# Patient Record
Sex: Female | Born: 2000 | Race: Black or African American | Hispanic: No | Marital: Single | Smoking: Never smoker
Health system: Southern US, Community
[De-identification: ages and names within clinical notes are randomized; demographics above are authoritative.]

## PROBLEM LIST (undated history)

## (undated) DIAGNOSIS — F319 Bipolar disorder, unspecified: Secondary | ICD-10-CM

## (undated) DIAGNOSIS — F32A Depression, unspecified: Secondary | ICD-10-CM

## (undated) DIAGNOSIS — R4689 Other symptoms and signs involving appearance and behavior: Secondary | ICD-10-CM

## (undated) DIAGNOSIS — F329 Major depressive disorder, single episode, unspecified: Secondary | ICD-10-CM

---
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
## 2023-07-04 NOTE — Progress Notes (Signed)
 Psychiatric Facility Acceptance Note   ACCEPTED TO: [facility]  BY:  Ebony  ON BEHALF OF: Dr. Oneil Charleston  CALL REPORT TO: [phone]. EMTALA:  initiated  TRANSPORTATION CALLED: [facility], awaiting custody order  NOTIFIED patient OF:  FACILITY ACCEPTANCE: [facility] TRANSPORT[facility] BY: [employer], [employer] 07/04/2023 12:47 PM
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
## 2023-07-18 NOTE — Progress Notes (Signed)
  Psychiatry was alerted by the security team that patient had eloped from the hospital. Attempted to reach family to no avail.   LAWERENCE GUT, NP

## 2023-07-18 NOTE — Progress Notes (Signed)
 Emergency Psychiatry - Follow-up Evaluation  Subjective Met with patient for reassessment in her room. Patient found sitting up in bed talking with the sitter. She asks if she could be discharged home to just go and drink as she does not care about getting better. She expresses disappointment in her relationships with people in the community, the health care system and the religious organizations. She states that a church once turned her back because of the way she dressed and she felt bad because she believed that churches are meant to accept people the way they are. Patient denies feeling suicidal or homicidal but would not care if she is no longer alive. Patient states that she gets more support from the drug dealers and gang members on the street and would not mind returning to them.   Objective PATIENT SUMMARY <redacted file path>  SYNOPSIS <redacted file path>  RESULTS REVIEW <redacted file path>  EVENT LOG <redacted file path>  REVIEW FLOWSHEETS <redacted file path> Temp:  [36.8 C (98.2 F)] 36.8 C (98.2 F) Heart Rate:  [77-88] 77 Resp:  [16-18] 18 BP: (122-133)/(84-96) 122/96 Physical Exam  Mental Status Exam <redacted file path> Mental Status Exam Appearance:  (obese, fairly groomed, good eye contact) Attitude: cooperative, defensive Psychomotor: normal Mood:  (I feel numb) Affect: labile Speech: fluent, regular rate and volume Thought Process:  (linear, goal directed and appears volitionally illogical at time) Thought Content: Normal Perceptual: no auditory or visual hallucinations Cognition: attention intact Orientation: oriented x 4 Memory: grossly intact Intelligence: consistent with age and education level Judgement: severely impaired Insight: severely impaired Suicidal: not present Violent/Homicidal: no homicidal/violent ideation    Psychometrics:    Labs: Chemistry: Recent Labs  Lab 07/16/23 1846 07/16/23 2208 07/17/23 0134 07/17/23 0519  NA  142  142 141  141   < > 139  K 3.5 3.2*   < > 3.5  CL 110* 112*   < > 112*  CO2 20* 21   < > 19*  BUN 8 7   < > 5*  CREATININE 1.0 0.9   < > 0.7  GLUCOSE 83 81   < > 85  CALCIUM 8.5* 8.3*   < > 8.1*  MG 1.9 1.9  --   --   PHOS 3.2  --   --   --    < > = values in this interval not displayed.   CBC: Recent Labs  Lab 07/16/23 1846  WBC 13.2*  HGB 12.9  HCT 36.8  MCV 90  PLT 275    EKG: Recent Labs  Lab 07/16/23 2304  VENTRATE 80  PRINTERVAL 190  QRSINTERVAL 98  QTINTERVAL 388  QTC 447   Urinalysis: Recent Labs  Lab 07/16/23 2314  COLORU Colorless  CLARITYU Clear  SPECGRAV 1.014  LABPH 7.0  PROTEINUA Negative  GLUCOSEU Negative  KETONESU Negative  BLOODU Negative  NITRITE Negative  LEUKOCYTESUR Negative  BILIRUBINUR Negative  UROBILINOGEN 0.2     Endocrine:No results for input(s): POCGLU, HGBA1C, TSH, T4FREE in the last 168 hours.  Invalid input(s): T3 Pregnancy: Lab Results  Component Value Date   POCHCG Negative 07/04/2023   Liver function: Recent Labs  Lab 07/17/23 0134  AST 21  ALT 18  TOTALPROTEIN 5.6*  ALB 3.2*  TBILI 0.5  ALKPHOS 60     Toxicology Screen:  Recent Labs    05/10/23 2251 07/04/23 0346 07/16/23 2314  AMPHETUR Negative Negative Negative  BARBURINE Negative Negative Negative  BENZOUR Negative Negative Positive*  COCAINESCRN Negative Negative Negative  OPIATESUR Negative Negative Negative  THCURINE Positive* Negative Negative   Coronavirus (COVID-19) SARS-CoV-2 PCR/NAAT  Date Value Ref Range Status  11/10/2019 Not Detected   Final   POC Coronavirus (COVID-19) SARS-CoV-2 Rapid Test  Date Value Ref Range Status  05/08/2023 Not Detected Not Detected Final   Coronavirus (COVID-19) SARS-CoV-2 Rapid Test  Date Value Ref Range Status  05/10/2023 Not Detected Not Detected Final      Assessment Safety assessment:  Ask Suicide-Screening Questions (ASQ) <redacted file path> 1. In the past few weeks, have you  wished you were dead?: No (2023/07/29 1845) 2. In the past few weeks, have you felt that you or your family would be better off if you were dead?: No (29-Jul-2023 1845) 3. In the past week, have you been having thoughts about killing yourself?: No (07-29-23 1845) 4. Have you ever tried to kill yourself?: No (29-Jul-2023 1845) 5. Are you having thoughts of killing yourself right now?: No (29-Jul-2023 1845) Suicide Risk Screening Result: Negative (no intervention necessary) (July 29, 2023 1845)   [place] has a negative ASQ screen and is at low risk for suicide.     Risk factors for harm to others: current substance use, chronic impulsivity, poor behavioral control, deficits in social cognitive or information-processing abilities, and high emotional distress  Protective factors against violence: positive social connections Violence Risk Level: moderate   Actions taken at this time to mitigate risk include:  - Acute risk is being mitigated by continuing physician/medical hold and/or IVC to maintain a safe environment.  Safety assessment:  Ask Suicide-Screening Questions (ASQ) <redacted file path> 1. In the past few weeks, have you wished you were dead?: No (07-29-23 1845) 2. In the past few weeks, have you felt that you or your family would be better off if you were dead?: No (2023/07/29 1845) 3. In the past week, have you been having thoughts about killing yourself?: No (2023-07-29 1845) 4. Have you ever tried to kill yourself?: No (2023/07/29 1845) 5. Are you having thoughts of killing yourself right now?: No (Jul 29, 2023 1845) Suicide Risk Screening Result: Negative (no intervention necessary) (July 29, 2023 1845)   [place] has a negative ASQ screen and is at low risk for suicide.    Risk factors for harm to others: current substance use, chronic impulsivity, and high emotional distress  Protective factors against violence: positive social connections Violence Risk Level: moderate   Actions taken at this time to  mitigate risk include:  - Acute risk is being mitigated by continuing physician/medical hold and/or IVC to maintain a safe environment.    Monnie is a 23 y.o. female with a history of psychiatric history of borderline personality disorder, PTSD, MDD, anxiety, and alcohol use d/o who presents for evaluation of safety after a suicide attempt by intentional ingestion of 2 bottles of Nyquil (118 ml each), 1 bottle of Dayquil (118 ml), 1 bottle of Aspirin (containing 24 tablets of 325 mg each), and 2 tablets of Zyrtec (10 mg/tablet). On interview this morning, patient presents calm, cooperative and interested in interview. She reported that the initial intention was to get high but did not care if she died in the process. She reported multiple stressors including lack of family support, and the anniversary of her children's death.   Patient has had multiple inpatient hospitalizations and she acknowledged non compliance with outpatient treatment.  She is also noted to have very chronically limited judgment and ongoing substance use, which puts him at elevated risk, although this  may not be mitigated by an acute psychiatric hospitalization. However, the chronicity of her illness, as evidenced by the multiple suicide attempts are quite concerning putting patient at elevated risk of dangerousness. We will initiate IVC and refer her to inpatient hospital.   Diagnoses:   : MDD and PTSD  [place] is a 23 y.o. female with a history of psychiatric history of borderline personality disorder, PTSD, MDD, anxiety, and alcohol use d/o who presents for evaluation of safety after a suicide attempt by intentional ingestion of 2 bottles of Nyquil (118 ml each), 1 bottle of Dayquil (118 ml), 1 bottle of Aspirin (containing 24 tablets of 325 mg each), and 2 tablets of Zyrtec (10 mg/tablet). On interview this morning, patient presents calm, cooperative and interested in interview. She reported that the initial intention was  to get high but did not care if she died in the process. She reported multiple stressors including lack of family support, and the anniversary of her children's death.    Patient has had multiple inpatient hospitalizations and she acknowledged non compliance with outpatient treatment.  She is also noted to have very chronically limited judgment and ongoing substance use, which puts him at elevated risk, although this may not be mitigated by an acute psychiatric hospitalization. However, the chronicity of her illness, as evidenced by the multiple suicide attempts are quite concerning putting patient at elevated risk of dangerousness. We will maintain patient on IVC and transfer her to [facility] when transport is available.   Diagnoses:  MDD and PTSD     Plan Disposition: Not psychiatrically stable for discharge. Our team will refer to inpatient psychiatric facilities for stabilization.  Safety:  Physician Hold/IVC: Physician Hold and IVC are indicated. Petition and 1st QPE completed on 07/17/23.  2nd QPE to be completed by accepting facility. Suicide precautions: yes Observation Status: continuous visual observation while patient in the medical ED, but transition to q15 minute checks if moved to the Behavioral Health ED  Meds: - Continue Hydroxyzine 50 mg po Q6hr PRN for anxiety - Continue Trazodone 50 mg po QHS PRN for sleep  Medically cleared  ED Follow-up: Psychiatry will follow up tomorrow. Outpatient Follow-Up: To be determined by inpatient psychiatric facility  Thank you for allowing psychiatry to be involved in this patient's care. Please call the [facility] ED Psychiatry team at [phone] for questions.     Attestation Statement:   I personally performed the service, non-incident to. (WP)   ADEPEJU DARWYN, NP

## 2023-07-20 NOTE — Progress Notes (Signed)
 Psychiatric Facility Acceptance Note   ACCEPTED TO: [facility], [address] BY:  Cat in Admissions ON BEHALF OF: Dr. Oneil Charleston CALL REPORT TO: [phone] EMTALA:  Initiated TRANSPORTATION CALLED: yes - [facility] [employer] contacted to coordinate transport with sheriff NOTIFIED patient OF:  FACILITY ACCEPTANCE: yes TRANSPORTATION BY: Law Enforcement  JUSTIN KRETZSCHMAR, LCSW, LCAS 07/20/2023 12:25 PM

## 2023-07-20 NOTE — ED Provider Notes (Signed)
 Med cleared awaiting placement and sheriff for transport.   EMTALA signed by ED attending with plan to transfer to [facility].  Patient did not require any further medical attention while under my care.   [place] Rosina Dragon, [place] 07/21/23 [phone]

## 2023-07-20 NOTE — Progress Notes (Signed)
 Received call from Emergency planning/management officer via [facility] [employer], clarifying that inpatient facility [facility]) could receive pt starting at 1900. Sheriff officer reported he would intend to arrive to ED around 1830 for transport. EM RN updated.

## 2023-08-07 ENCOUNTER — Other Ambulatory Visit: Payer: Self-pay

## 2023-08-07 ENCOUNTER — Emergency Department
Admission: EM | Admit: 2023-08-07 | Discharge: 2023-08-07 | Disposition: A | Discharge status: Home / Self Care | Attending: Emergency Medicine | Admitting: Emergency Medicine

## 2023-08-07 DIAGNOSIS — F1094 Alcohol use, unspecified with alcohol-induced mood disorder: Secondary | ICD-10-CM | POA: Insufficient documentation

## 2023-08-07 DIAGNOSIS — F332 Major depressive disorder, recurrent severe without psychotic features: Secondary | ICD-10-CM | POA: Diagnosis not present

## 2023-08-07 DIAGNOSIS — R45851 Suicidal ideations: Secondary | ICD-10-CM | POA: Insufficient documentation

## 2023-08-07 DIAGNOSIS — I10 Essential (primary) hypertension: Secondary | ICD-10-CM | POA: Diagnosis not present

## 2023-08-07 DIAGNOSIS — Y900 Blood alcohol level of less than 20 mg/100 ml: Secondary | ICD-10-CM | POA: Diagnosis not present

## 2023-08-07 LAB — ETHANOL: Alcohol, Ethyl (B): <15 mg/dL (ref <15)

## 2023-08-07 LAB — COMPREHENSIVE METABOLIC PANEL WITH GFR
ALT: 49 U/L — ABNORMAL HIGH (ref 0–44)
AST: 65 U/L — ABNORMAL HIGH (ref 15–41)
Albumin: 3.7 g/dL (ref 3.5–5.0)
Alkaline Phosphatase: 67 U/L (ref 38–126)
Anion gap: 11 (ref 5–15)
BUN: 8 mg/dL (ref 6–20)
CO2: 24 mmol/L (ref 22–32)
Calcium: 8.9 mg/dL (ref 8.9–10.3)
Chloride: 108 mmol/L (ref 98–111)
Creatinine, Ser: 0.67 mg/dL (ref 0.44–1.00)
GFR, Estimated: >60 mL/min (ref >60)
Glucose, Bld: 109 mg/dL — ABNORMAL HIGH (ref 70–99)
Potassium: 3.7 mmol/L (ref 3.5–5.1)
Sodium: 143 mmol/L (ref 135–145)
Total Bilirubin: 0.7 mg/dL (ref 0.0–1.2)
Total Protein: 6.5 g/dL (ref 6.5–8.1)

## 2023-08-07 LAB — CBC
HCT: 39 % (ref 36.0–46.0)
Hemoglobin: 12.9 g/dL (ref 12.0–15.0)
MCH: 30.6 pg (ref 26.0–34.0)
MCHC: 33.1 g/dL (ref 30.0–36.0)
MCV: 92.6 fL (ref 80.0–100.0)
Platelets: 310 K/uL (ref 150–400)
RBC: 4.21 MIL/uL (ref 3.87–5.11)
RDW: 13 % (ref 11.5–15.5)
WBC: 11.7 K/uL — ABNORMAL HIGH (ref 4.0–10.5)
nRBC: 0 % (ref 0.0–0.2)

## 2023-08-07 LAB — SALICYLATE LEVEL: Salicylate Lvl: <7 mg/dL — ABNORMAL LOW (ref 7.0–30.0)

## 2023-08-07 LAB — ACETAMINOPHEN LEVEL: Acetaminophen (Tylenol), Serum: <10 ug/mL — ABNORMAL LOW (ref 10–30)

## 2023-08-07 MED ORDER — THIAMINE HCL 100 MG/ML IJ SOLN: 100.0000 mg | Freq: Every day | INTRAMUSCULAR | Status: DC

## 2023-08-07 MED ORDER — THIAMINE MONONITRATE 100 MG PO TABS: 100.0000 mg | ORAL_TABLET | Freq: Every day | ORAL | Status: DC

## 2023-08-07 MED ORDER — ADULT MULTIVITAMIN W/MINERALS CH: 1.0000 | ORAL_TABLET | Freq: Every day | ORAL | Status: DC

## 2023-08-07 MED ORDER — FOLIC ACID 1 MG PO TABS: 1.0000 mg | ORAL_TABLET | Freq: Every day | ORAL | Status: DC

## 2023-08-07 MED ORDER — LORAZEPAM 1 MG PO TABS: 1.0000 mg | ORAL_TABLET | ORAL | Status: DC | PRN

## 2023-08-07 NOTE — ED Notes (Signed)
 Breakfast tray given to pt

## 2023-08-07 NOTE — ED Notes (Addendum)
 Pt belongings:  Multi color dress [employer] Black bag

## 2023-08-07 NOTE — ED Notes (Signed)
 Patient called father for ride home. Patient did not wish for this RN to give information to father, this RN explained patient rights to father on phone, but informed father that patient was up for discharge per patient request. All belongings returned to patient at time of discharge. Patient ambulatory to lobby to wait for ride. Patient deferred discharge vitals.

## 2023-08-07 NOTE — Consult Note (Signed)
 [facility]   Psychiatry Consult  Assessment and plan Alcohol use disorder/alcohol induced mood disorder Patient is a 23 year old female who has significant challenges with depression and alcohol abuse she is not an imminent danger to self or others can be discharged to outpatient follow-up.      Reason for Consult: Suicidal thoughts Referring Physician:  No att. providers found  Patient Identification: Monique Perez MRN:  [id number] Principal Diagnosis: <principal problem not specified> Diagnosis:   Alcohol use disorder/alcohol induced mood disorder   Total Time spent with patient: 30 minutes  Subjective:   Monique Perez is a 23 y.o. female patient admitted with due to depression and suicidal thoughts.  HPI: Patient is a 23 year old female who currently lives with her father reportedly came here to meet with a friend reportedly things did not go well they were drinking and she felt like she was getting bullied by these people she recently met this guy who she wanted to spend time with but reportedly his friends were bullying her and she was also drinking heavily she reports that she drinks on a daily basis usually whiskey she is denying any suicidal and homicidal thoughts she does talk about losing her stillborn babies.  History does not match that her current stressors are related to that no symptoms of mania hypomania psychosis  Past Psychiatric History: History of alcohol use disorder  Risk to Self: None  Risk to Others: None  Prior Inpatient Therapy:  Once before Prior Outpatient Therapy: Follows up with a psychiatrist and therapist   Past Medical History:  Past Medical History:  Diagnosis Date   Aggressive behavior        Depression    History reviewed. No pertinent surgical history. Family History: History reviewed. No pertinent family history. Family Psychiatric  History: No suicidal attempt Social History:  Social History   Substance and Sexual Activity  Alcohol  Use No     Social History   Substance and Sexual Activity  Drug Use No    Social History   Socioeconomic History   Marital status: Single    Spouse name: Not on file   Number of children: Not on file   Years of education: Not on file   Highest education level: Not on file  Occupational History   Not on file  Tobacco Use   Smoking status: Never   Smokeless tobacco: Not on file  Substance and Sexual Activity   Alcohol use: No   Drug use: No   Sexual activity: Not on file  Other Topics Concern   Not on file  Social History Narrative   Not on file   Social Drivers of Health   Financial Resource Strain: Low Risk  (06/01/2023)   Received from [facility]   Overall Financial Resource Strain (CARDIA)    Difficulty of Paying Living Expenses: Not hard at all  Food Insecurity: No Food Insecurity (06/01/2023)   Received from [facility]   Hunger Vital Sign    Within the past 12 months, you worried that your food would run out before you got the money to buy more.: Never true    Within the past 12 months, the food you bought just didn't last and you didn't have money to get more.: Never true  Transportation Needs: No Transportation Needs (06/01/2023)   Received from [facility]   PRAPARE - Transportation    Lack of Transportation (Medical): No    Lack of Transportation (Non-Medical): No  Physical Activity: Not on file  Stress: Not on file  Social Connections: Not on file   Additional Social History:    Allergies:  No Known Allergies  Labs:  Results for orders placed or performed during the hospital encounter of 08/07/23 (from the past 48 hours)  Comprehensive metabolic panel     Status: Abnormal   Collection Time: 08/07/23  3:18 AM  Result Value Ref Range   Sodium 143 135 - 145 mmol/L   Potassium 3.7 3.5 - 5.1 mmol/L   Chloride 108 98 - 111 mmol/L   CO2 24 22 - 32 mmol/L   Glucose, Bld 109 (H) 70 - 99 mg/dL    Comment: Glucose reference range applies only  to samples taken after fasting for at least 8 hours.   BUN 8 6 - 20 mg/dL   Creatinine, Ser 9.32 0.44 - 1.00 mg/dL   Calcium 8.9 8.9 - 89.6 mg/dL   Total Protein 6.5 6.5 - 8.1 g/dL   Albumin 3.7 3.5 - 5.0 g/dL   AST 65 (H) 15 - 41 U/L   ALT 49 (H) 0 - 44 U/L   Alkaline Phosphatase 67 38 - 126 U/L   Total Bilirubin 0.7 0.0 - 1.2 mg/dL   GFR, Estimated >39 >39 mL/min    Comment: (NOTE) Calculated using the CKD-EPI Creatinine Equation (2021)    Anion gap 11 5 - 15    Comment: Performed at [facility], [address] [place], [place] [address]  Ethanol     Status: None   Collection Time: 08/07/23  3:18 AM  Result Value Ref Range   Alcohol, Ethyl (B) <15 <15 mg/dL    Comment: (NOTE) For medical purposes only. Performed at [facility], [address] [place], [place] [address]   cbc     Status: Abnormal   Collection Time: 08/07/23  3:18 AM  Result Value Ref Range   WBC 11.7 (H) 4.0 - 10.5 K/uL   RBC 4.21 3.87 - 5.11 MIL/uL   Hemoglobin 12.9 12.0 - 15.0 g/dL   HCT 60.9 63.9 - 53.9 %   MCV 92.6 80.0 - 100.0 fL   MCH 30.6 26.0 - 34.0 pg   MCHC 33.1 30.0 - 36.0 g/dL   RDW 86.9 88.4 - 84.4 %   Platelets 310 150 - 400 K/uL   nRBC 0.0 0.0 - 0.2 %    Comment: Performed at [facility], [address] [place], [place] [address]  Acetaminophen  level     Status: Abnormal   Collection Time: 08/07/23  3:18 AM  Result Value Ref Range   Acetaminophen  (Tylenol ), Serum <10 (L) 10 - 30 ug/mL    Comment: (NOTE) Therapeutic concentrations vary significantly. A range of 10-30 ug/mL  may be an effective concentration for many patients. However, some  are best treated at concentrations outside of this range. Acetaminophen  concentrations >150 ug/mL at 4 hours after ingestion  and >50 ug/mL at 12 hours after ingestion are often associated with  toxic reactions.  Performed at [facility], [address] [place], [place] [address]   Salicylate level      Status: Abnormal   Collection Time: 08/07/23  3:18 AM  Result Value Ref Range   Salicylate Lvl <7.0 (L) 7.0 - 30.0 mg/dL    Comment: Performed at [facility], [address] [place], [place] [address]    Current Facility-Administered Medications  Medication Dose Route Frequency Provider Last Rate Last Admin   folic  acid (FOLVITE) tablet 1 mg  1 mg Oral Daily Cyrena Mylar, MD       LORazepam (ATIVAN) tablet 1-4 mg  1-4 mg Oral Q1H PRN Wong, Silas, MD       multivitamin with minerals tablet 1 tablet  1 tablet Oral Daily Cyrena Mylar, MD       thiamine (VITAMIN B1) tablet 100 mg  100 mg Oral Daily Cyrena Mylar, MD       Or   thiamine (VITAMIN B1) injection 100 mg  100 mg Intravenous Daily Cyrena Mylar, MD       Current Outpatient Medications  Medication Sig Dispense Refill   ARIPiprazole  (ABILIFY ) 5 MG tablet Take 5 mg by mouth daily.     DULoxetine (CYMBALTA) 30 MG capsule Take 30 mg by mouth 2 (two) times daily.     DULoxetine (CYMBALTA) 60 MG capsule Take 60 mg by mouth daily.     hydrOXYzine (ATARAX) 50 MG tablet Take 50 mg by mouth every 6 (six) hours as needed for anxiety.     hydrOXYzine (VISTARIL) 25 MG capsule Take 25 mg by mouth every 6 (six) hours as needed.     melatonin 3 MG TABS tablet Take 3 mg by mouth at bedtime as needed.     prazosin (MINIPRESS) 1 MG capsule Take 3 mg by mouth at bedtime.     traZODone (DESYREL) 50 MG tablet Take 50 mg by mouth at bedtime as needed.      Musculoskeletal: Strength & Muscle Tone: within normal limits Gait & Station: normal Patient leans: N/A            Psychiatric Specialty Exam:  Patient is a 23 year old female who appears stated age alert oriented x 3 mood is okay affect is constricted thought process logical and current denies any suicidal thoughts or perceptual disturbances Insight and judgment fair.  Physical Exam: Physical Exam ROS Blood pressure (!) 150/92, pulse 69, temperature 98.1 F (36.7 C),  resp. rate 16, height 5' 9 (1.753 m), weight 127 kg, SpO2 100%. Body mass index is 41.35 kg/m.  Treatment Plan Summary: Daily contact with patient to assess and evaluate symptoms and progress in treatment  Disposition: No evidence of imminent risk to self or others at present.    Millie JONELLE Manners, MD 08/07/2023 9:22 AM

## 2023-08-07 NOTE — ED Triage Notes (Signed)
 Pt reports 3 years ago this month her twins passed away, pt reports depression and SI thoughts. Pt calm and cooperative. Pt denies plan for SI
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
## 2023-08-07 NOTE — ED Provider Notes (Signed)
 [facility] Provider Note    Event Date/Time   First MD Initiated Contact with Patient 08/07/23 [phone]     (approximate)   History   Psychiatric Evaluation   HPI  Monique Perez is a 23 y.o. female   Past medical history of bipolar and depression who presents to the emergency department with depression and suicidal ideation, passive without a plan and no self-harm.  She feels depressed as she is nearing the 3-year anniversary of the death of her twins.  She has no plan.  She did not try to harm herself in any way.  She denies ingestions.  She does drink alcohol occasionally, most days she does drink 1 tall can of malt liquor.  She denies drug use.  External Medical Documents Reviewed:  Psychiatry note from earlier this summer      Physical Exam   Triage Vital Signs: ED Triage Vitals  Encounter Vitals Group     BP 08/07/23 0317 (!) 150/92     Girls Systolic BP Percentile --      Girls Diastolic BP Percentile --      Boys Systolic BP Percentile --      Boys Diastolic BP Percentile --      Pulse Rate 08/07/23 0317 69     Resp 08/07/23 0317 16     Temp 08/07/23 0317 98.1 F (36.7 C)     Temp src --      SpO2 08/07/23 0317 100 %     Weight 08/07/23 0316 280 lb (127 kg)     Height 08/07/23 0316 5' 9 (1.753 m)     Head Circumference --      Peak Flow --      Pain Score 08/07/23 0316 0     Pain Loc --      Pain Education --      Exclude from Growth Chart --     Most recent vital signs: Vitals:   08/07/23 0317  BP: (!) 150/92  Pulse: 69  Resp: 16  Temp: 98.1 F (36.7 C)  SpO2: 100%    General: Awake, no distress.  CV:  Good peripheral perfusion.  Resp:  Normal effort.  Abd:  No distention.  Other:  Old appearing well-healed scars on left forearm.  Soft nontender abdomen.  Hemodynamics reviewed and significant for mild hypertension otherwise normal.   ED Results / Procedures / Treatments   Labs (all labs ordered are listed,  but only abnormal results are displayed) Labs Reviewed  COMPREHENSIVE METABOLIC PANEL WITH GFR - Abnormal; Notable for the following components:      Result Value   Glucose, Bld 109 (*)    AST 65 (*)    ALT 49 (*)    All other components within normal limits  CBC - Abnormal; Notable for the following components:   WBC 11.7 (*)    All other components within normal limits  ACETAMINOPHEN  LEVEL - Abnormal; Notable for the following components:   Acetaminophen  (Tylenol ), Serum <10 (*)    All other components within normal limits  SALICYLATE LEVEL - Abnormal; Notable for the following components:   Salicylate Lvl <7.0 (*)    All other components within normal limits  ETHANOL  URINE DRUG SCREEN, QUALITATIVE ([facility] ONLY)  POC URINE PREG, ED     I ordered and reviewed the above labs they are notable for cell counts and electrolytes lites unremarkable.   PROCEDURES:  Critical Care performed: No  Procedures  MEDICATIONS ORDERED IN ED: Medications  LORazepam (ATIVAN) tablet 1-4 mg (has no administration in time range)  thiamine (VITAMIN B1) tablet 100 mg (has no administration in time range)    Or  thiamine (VITAMIN B1) injection 100 mg (has no administration in time range)  folic acid (FOLVITE) tablet 1 mg (has no administration in time range)  multivitamin with minerals tablet 1 tablet (has no administration in time range)     IMPRESSION / MDM / ASSESSMENT AND PLAN / ED COURSE  I reviewed the triage vital signs and the nursing notes.                                Patient's presentation is most consistent with acute presentation with potential threat to life or bodily function.  Differential diagnosis includes, but is not limited to, depression, passive suicidal ideation, substance-induced mood disorder    MDM: Here voluntarily requesting psychiatric evaluation for passive suicidal ideation and depression as she nears the 3-year anniversary of the death of her  children.  She denies any self-harm attempts or ingestions but does drink alcohol nearly daily, not exhibiting signs of withdrawal and does not appear intoxicated at this time.  Will put on CIWA.  Check basic labs and toxicologic labs, unremarkable, plan for medical clearance and psychiatric consultation for evaluation.  She is here voluntarily and is not actively suicidal nor psychotic, I do not see rationale for IVC at this time      FINAL CLINICAL IMPRESSION(S) / ED DIAGNOSES   Final diagnoses:  Suicidal ideation     Rx / DC Orders   ED Discharge Orders     None        Note:  This document was prepared using Dragon voice recognition software and may include unintentional dictation errors.    Cyrena Mylar, MD 08/07/23 [phone]

## 2023-08-07 NOTE — ED Provider Notes (Signed)
 Emergency Medicine Observation Re-evaluation Note  Monique Perez is a 23 y.o. female, seen on rounds today.  Pt initially presented to the ED for complaints of Psychiatric Evaluation  Currently, the patient is calm, no acute complaints.  Physical Exam  Blood pressure (!) 150/92, pulse 69, temperature 98.1 F (36.7 C), resp. rate 16, height 5' 9 (1.753 m), weight 127 kg, SpO2 100%. Physical Exam General: NAD Lungs: CTAB Psych: not agitated  ED Course / MDM  EKG:    I have reviewed the labs performed to date as well as medications administered while in observation.  Recent changes in the last 24 hours include no acute events overnight.  Evald by psych, cleared for dc   Plan  Current plan is for dc. Patient is not under full IVC at this time.   Monique Pastor, MD 08/07/23 [phone]

## 2023-08-07 NOTE — ED Notes (Signed)
vol/psych consult ordered/pending..
